# Patient Record
Sex: Female | Born: 1970 | Race: White | Hispanic: No | Marital: Married | Smoking: Never smoker
Health system: Southern US, Community
[De-identification: ages and names within clinical notes are randomized; demographics above are authoritative.]

## PROBLEM LIST (undated history)

## (undated) DIAGNOSIS — C801 Malignant (primary) neoplasm, unspecified: Secondary | ICD-10-CM

## (undated) DIAGNOSIS — F32A Depression, unspecified: Secondary | ICD-10-CM

## (undated) DIAGNOSIS — K219 Gastro-esophageal reflux disease without esophagitis: Secondary | ICD-10-CM

## (undated) DIAGNOSIS — F419 Anxiety disorder, unspecified: Secondary | ICD-10-CM

## (undated) DIAGNOSIS — F329 Major depressive disorder, single episode, unspecified: Secondary | ICD-10-CM

## (undated) DIAGNOSIS — T7840XA Allergy, unspecified, initial encounter: Secondary | ICD-10-CM

## (undated) HISTORY — DX: Gastro-esophageal reflux disease without esophagitis: K21.9

## (undated) HISTORY — DX: Depression, unspecified: F32.A

## (undated) HISTORY — DX: Allergy, unspecified, initial encounter: T78.40XA

## (undated) HISTORY — DX: Anxiety disorder, unspecified: F41.9

## (undated) HISTORY — DX: Malignant (primary) neoplasm, unspecified: C80.1

## (undated) HISTORY — PX: CERVICAL FUSION: SHX112

---
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
## 1997-12-04 ENCOUNTER — Ambulatory Visit (HOSPITAL_COMMUNITY): Admission: RE | Admit: 1997-12-04 | Discharge: 1997-12-04 | Payer: Self-pay | Admitting: Obstetrics and Gynecology

## 1998-01-23 ENCOUNTER — Inpatient Hospital Stay (HOSPITAL_COMMUNITY): Admission: AD | Admit: 1998-01-23 | Discharge: 1998-01-23 | Payer: Self-pay | Admitting: Obstetrics and Gynecology

## 1998-01-27 ENCOUNTER — Encounter (HOSPITAL_COMMUNITY): Admission: RE | Admit: 1998-01-27 | Discharge: 1998-02-20 | Payer: Self-pay | Admitting: Obstetrics and Gynecology

## 1998-02-18 ENCOUNTER — Inpatient Hospital Stay (HOSPITAL_COMMUNITY): Admission: AD | Admit: 1998-02-18 | Discharge: 1998-02-23 | Payer: Self-pay | Admitting: Obstetrics and Gynecology

## 1998-02-25 ENCOUNTER — Inpatient Hospital Stay (HOSPITAL_COMMUNITY): Admission: AD | Admit: 1998-02-25 | Discharge: 1998-02-25 | Payer: Self-pay | Admitting: Obstetrics and Gynecology

## 1998-12-19 ENCOUNTER — Encounter: Payer: Self-pay | Admitting: Internal Medicine

## 1998-12-19 ENCOUNTER — Ambulatory Visit (HOSPITAL_COMMUNITY): Admission: RE | Admit: 1998-12-19 | Discharge: 1998-12-19 | Payer: Self-pay | Admitting: Internal Medicine

## 2001-02-21 ENCOUNTER — Other Ambulatory Visit: Admission: RE | Admit: 2001-02-21 | Discharge: 2001-02-21 | Payer: Self-pay | Admitting: Obstetrics and Gynecology

## 2001-05-15 ENCOUNTER — Encounter: Admission: RE | Admit: 2001-05-15 | Discharge: 2001-05-15 | Payer: Self-pay | Admitting: Obstetrics and Gynecology

## 2001-05-15 ENCOUNTER — Encounter: Payer: Self-pay | Admitting: Obstetrics and Gynecology

## 2001-09-26 ENCOUNTER — Inpatient Hospital Stay (HOSPITAL_COMMUNITY): Admission: AD | Admit: 2001-09-26 | Discharge: 2001-09-26 | Payer: Self-pay | Admitting: Obstetrics and Gynecology

## 2001-10-31 ENCOUNTER — Encounter: Payer: Self-pay | Admitting: Obstetrics and Gynecology

## 2001-10-31 ENCOUNTER — Ambulatory Visit (HOSPITAL_COMMUNITY): Admission: RE | Admit: 2001-10-31 | Discharge: 2001-10-31 | Payer: Self-pay | Admitting: Obstetrics and Gynecology

## 2002-03-28 ENCOUNTER — Inpatient Hospital Stay (HOSPITAL_COMMUNITY): Admission: AD | Admit: 2002-03-28 | Discharge: 2002-03-31 | Payer: Self-pay | Admitting: Obstetrics and Gynecology

## 2002-05-09 ENCOUNTER — Other Ambulatory Visit: Admission: RE | Admit: 2002-05-09 | Discharge: 2002-05-09 | Payer: Self-pay | Admitting: Obstetrics and Gynecology

## 2003-07-18 ENCOUNTER — Encounter: Admission: RE | Admit: 2003-07-18 | Discharge: 2003-07-18 | Payer: Self-pay | Admitting: Obstetrics and Gynecology

## 2004-09-08 ENCOUNTER — Other Ambulatory Visit: Admission: RE | Admit: 2004-09-08 | Discharge: 2004-09-08 | Payer: Self-pay | Admitting: Obstetrics and Gynecology

## 2004-10-12 ENCOUNTER — Ambulatory Visit (HOSPITAL_COMMUNITY): Admission: RE | Admit: 2004-10-12 | Discharge: 2004-10-12 | Payer: Self-pay | Admitting: Internal Medicine

## 2005-09-01 ENCOUNTER — Encounter: Admission: RE | Admit: 2005-09-01 | Discharge: 2005-09-01 | Payer: Self-pay | Admitting: Internal Medicine

## 2007-06-02 IMAGING — MG MM DIAGNOSTIC BILATERAL
5 series · 5 of 5 positions shown · non-contrast
Comparison: none

DG DIAGNOSTIC BILATERAL
Bilateral CC and MLO view(s) were taken.

LEFT BREAST ULTRASOUND
DIGITAL BILATERAL DIAGNOSTIC MAMMOGRAM WITH CAD AND LEFT BREAST ULTRASOUND:
CLINICAL DATA: The patient is experiencing focal pain and a "hard place" in the left upper outer 
quadrant.  She states the hardness has resolved today.

[R CC]
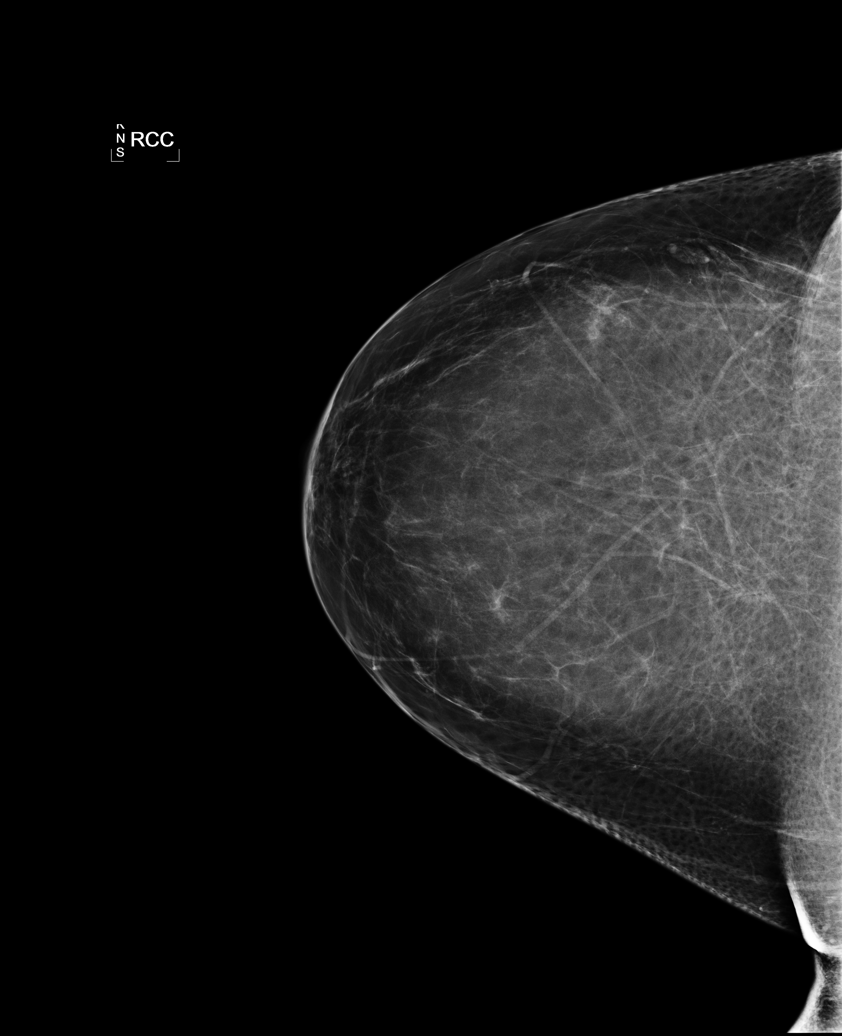

[L CC]
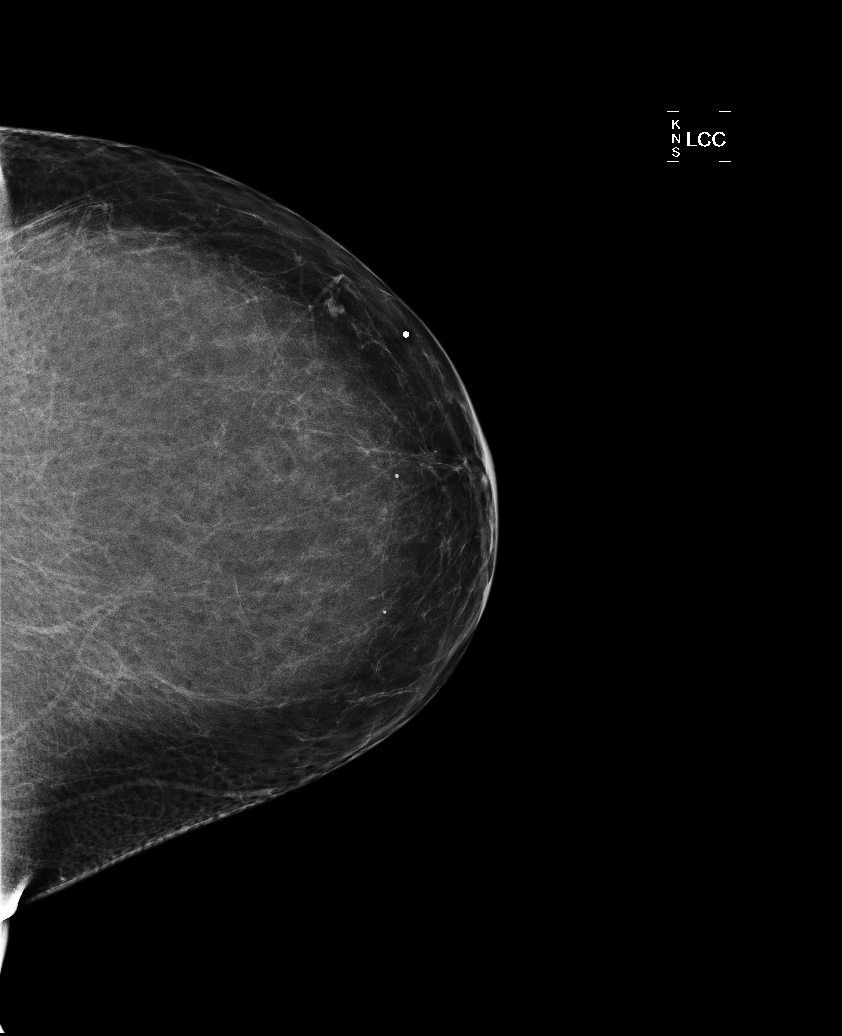

[L MLO]
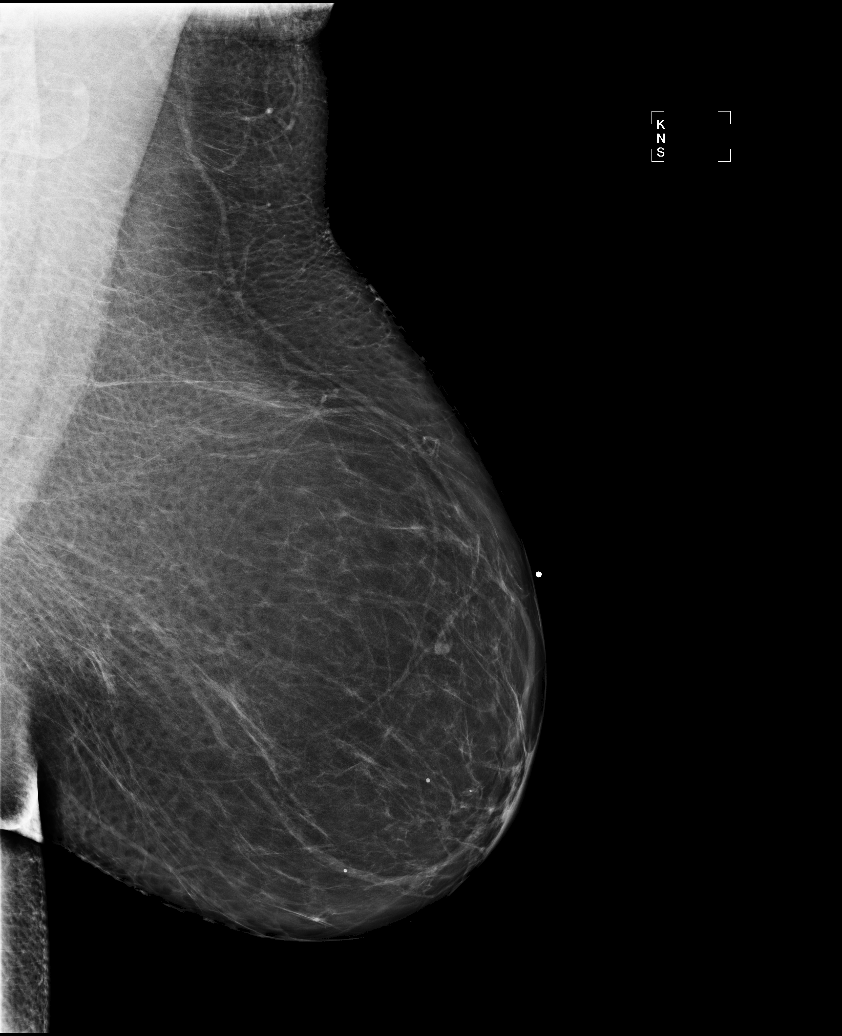

[R MLO]
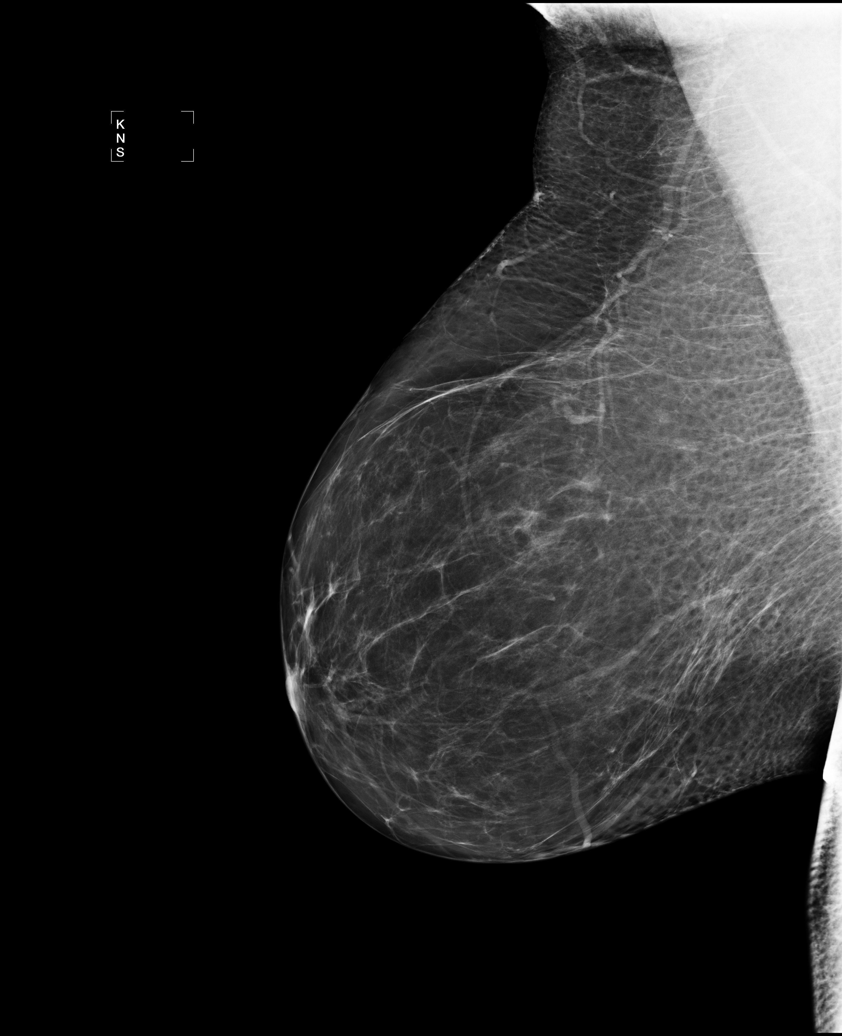

[L TAN]
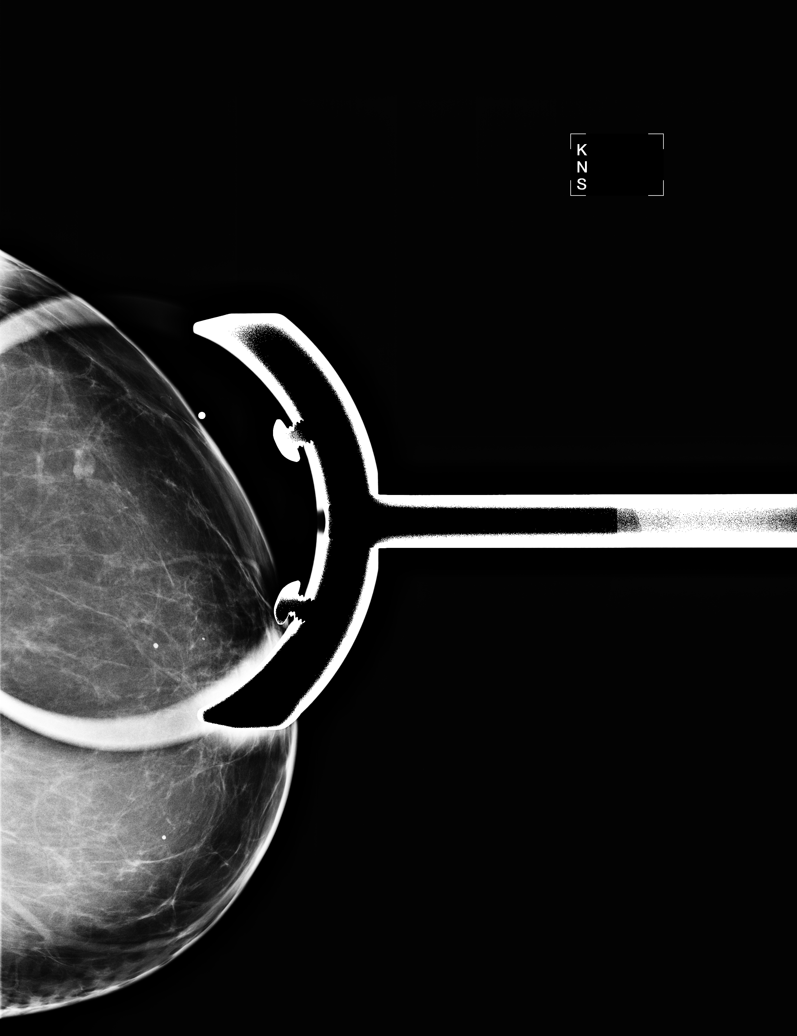

[5 of 5 positions shown; findings below may reference images not displayed]

Comparison is made to prior study dated 07-18-03.  The breast tissue is almost entirely fatty.  
There is no dominant mass, architectural distortion or calcification to suggest malignancy.  There 
is an intramammary lymph node in the left upper outer quadrant.

On physical examination, no mass is palpated either by me or the patient in the left anterior upper
outer quadrant.  Sonography demonstrates fatty tissue.  The intramammary lymph node seen on the 
mammogram is visualized but this is not in the area of most pain for the patient.
IMPRESSION: No mammographic or sonographic evidence of malignancy.   Yearly screening mammography is suggested 
beginning at age 40 unless clinically indicated earlier.

ASSESSMENT: Negative - BI-RADS 1

Routine screening mammogram at age 40.
ANALYZED BY COMPUTER AIDED DETECTION. ,

## 2010-05-17 ENCOUNTER — Encounter: Payer: Self-pay | Admitting: Internal Medicine

## 2017-11-09 ENCOUNTER — Other Ambulatory Visit (HOSPITAL_COMMUNITY): Payer: Self-pay | Admitting: Neurological Surgery

## 2017-11-09 DIAGNOSIS — M542 Cervicalgia: Secondary | ICD-10-CM

## 2017-11-09 DIAGNOSIS — M79602 Pain in left arm: Secondary | ICD-10-CM

## 2017-11-18 ENCOUNTER — Ambulatory Visit (HOSPITAL_COMMUNITY)
Admission: RE | Admit: 2017-11-18 | Discharge: 2017-11-18 | Disposition: A | Discharge status: Home / Self Care | Payer: 59 | Source: Ambulatory Visit | Attending: Neurological Surgery | Admitting: Neurological Surgery

## 2017-11-18 DIAGNOSIS — M542 Cervicalgia: Secondary | ICD-10-CM

## 2017-11-18 DIAGNOSIS — M79602 Pain in left arm: Secondary | ICD-10-CM | POA: Diagnosis present

## 2017-11-18 DIAGNOSIS — Z981 Arthrodesis status: Secondary | ICD-10-CM | POA: Insufficient documentation

## 2018-12-12 ENCOUNTER — Other Ambulatory Visit: Payer: Self-pay | Admitting: Internal Medicine

## 2018-12-12 DIAGNOSIS — N6089 Other benign mammary dysplasias of unspecified breast: Secondary | ICD-10-CM

## 2018-12-28 ENCOUNTER — Other Ambulatory Visit: Payer: Self-pay | Admitting: *Deleted

## 2018-12-28 ENCOUNTER — Telehealth: Payer: Self-pay | Admitting: *Deleted

## 2018-12-28 ENCOUNTER — Ambulatory Visit (INDEPENDENT_AMBULATORY_CARE_PROVIDER_SITE_OTHER): Payer: 59 | Admitting: Neurology

## 2018-12-28 ENCOUNTER — Other Ambulatory Visit: Payer: Self-pay

## 2018-12-28 ENCOUNTER — Encounter: Payer: Self-pay | Admitting: Neurology

## 2018-12-28 VITALS — BP 142/84 | HR 88 | Temp 98.0°F | Ht 64.0 in | Wt 245.0 lb

## 2018-12-28 DIAGNOSIS — R002 Palpitations: Secondary | ICD-10-CM

## 2018-12-28 DIAGNOSIS — G478 Other sleep disorders: Secondary | ICD-10-CM | POA: Diagnosis not present

## 2018-12-28 DIAGNOSIS — E66813 Obesity, class 3: Secondary | ICD-10-CM | POA: Insufficient documentation

## 2018-12-28 DIAGNOSIS — Z6841 Body Mass Index (BMI) 40.0 and over, adult: Secondary | ICD-10-CM | POA: Diagnosis not present

## 2018-12-28 NOTE — Telephone Encounter (Signed)
ZIO XT long term holter monitor to be mailed to patients home. Instructions reviewed briefly with patient as they are included in the monitor kit.

## 2018-12-28 NOTE — Progress Notes (Signed)
SLEEP MEDICINE CLINIC    Provider:  Larey Seat, MD  Primary Care Physician:  Sueanne Margarita [address] [place] [address]     Referring Provider: Sueanne Margarita, DO         Chief Complaint according to patient   Patient presents with:    . New Patient (Initial Visit)     pt denies snoring in sleep, never had sleep study before. PCP wanted to rule out. wakes up frequently in sleep.Reports palpitations and morning headaches.       HISTORY OF PRESENT ILLNESS:  Sheena Sanders is a 48 y.o. year old White female patient seen here face to face on 12/28/2018 for a sleep consultation.  Chief concern according to patient : " I wake up for years at 3.30 Am or 4 AM, but new is an onset of severe fatigue. by 4 Pm I either have to give into sleep or start some activity.    I have the pleasure of seeing Sheena Sanders today, a right-handed  Caucasian female and daughter of an established patient in our practice.   She has GERD, Insomnia, fatigue, obesity. previous anterior fusion surgery for cervical DDD. No previous sleep study. New onset burning in feet and toes, either side, not together. Right hand feels sometimes as if a bug crawls over the dorsum , nothing is there.     Sleep relevant medical history:     Family medical /sleep history: mother with MS, CSA, OSA, obesity , atrial fibrillation.   a fib in both parents. Father is caretaker of her handicapped mother, Sheena Sanders. Social history:  Patient is retired from [employer], due to covid and lives in a household with 4 persons. Family status is married with 2 children, joseph 64, Walker 57 years old.  Pets - 75 pound dog.  Tobacco use: never .  ETOH use; 3/ week , Caffeine intake in form of Coffee( 2 cups ) Soda( none Tea ( none) nor energy drinks. Regular exercise in form of walking    Hobbies :gardening, bird watching.     Sleep habits are as follows: The patient's dinner time is between 6 PM. The patient goes to bed at 10-  12  PM and continues to sleep for 4 hours, but has no bathroom breaks,but spontaneous  the first time 3-4 AM.   The preferred sleep position is the right side, with the support of 2 pillows. Adjustable head of bed.  Dreams are reportedly frequent/vivid. She dreams and wakes out of dreams.  She has palpitations if she drank alcohol.  8 AM is the usual rise time. The patient wakes up spontaneously. Bedroom is cool, quiet and dark.  She reports not feeling refreshed or restored in AM, sometimes groggy -with symptoms such as dry mouth, morning headaches and residual fatigue.  Naps are frequently  taken at 4 PM if she can not longer fight sleepiness, duration 20 minutes- refreshing. More refreshing than nocturnal sleep.    Review of Systems: Out of a complete 14 system review, the patient complains of only the following symptoms, and all other reviewed systems are negative.:  Fatigue, sleepiness , she denies snoring, fragmented sleep.   How likely are you to doze in the following situations: 0 = not likely, 1 = slight chance, 2 = moderate chance, 3 = high chance   Sitting and Reading? Watching Television? Sitting inactive in a public place (theater or meeting)? As a passenger in a car  for an hour without a break? Lying down in the afternoon when circumstances permit? Sitting and talking to someone? Sitting quietly after lunch without alcohol? In a car, while stopped for a few minutes in traffic?   Total = 13/ 24 points   FSS endorsed at 33/ 63 points.     Social History   Socioeconomic History  . Marital status: Married    Spouse name: Not on file  . Number of children: Not on file  . Years of education: Not on file  . Highest education level: Not on file  Occupational History  . Not on file  Social Needs  . Financial resource strain: Not on file  . Food insecurity    Worry: Not on file    Inability: Not on file  . Transportation needs    Medical: Not on file    Non-medical:  Not on file  Tobacco Use  . Smoking status: Never Smoker  . Smokeless tobacco: Never Used  Substance and Sexual Activity  . Alcohol use: Yes    Comment: occasional  . Drug use: Never  . Sexual activity: Not on file  Lifestyle  . Physical activity    Days per week: Not on file    Minutes per session: Not on file  . Stress: Not on file  Relationships  . Social Herbalist on phone: Not on file    Gets together: Not on file    Attends religious service: Not on file    Active member of club or organization: Not on file    Attends meetings of clubs or organizations: Not on file    Relationship status: Not on file  Other Topics Concern  . Not on file  Social History Narrative  . 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 Unemployed.    Current Outpatient Medications on File Prior to Visit  Medication Sig Dispense Refill  . omeprazole (PRILOSEC) 40 MG capsule Take 40 mg by mouth daily.     No current facility-administered medications on file prior to visit.     Allergies  Allergen Reactions  . Sulfa Antibiotics Swelling and Hives    unknown     Physical exam:  Today's Vitals   12/28/18 0846  BP: (!) 142/84  Pulse: 88  Temp: 98 F (36.7 C)  Weight: 245 lb (111.1 kg)  Height: 5\' 4"  (1.626 m)   Body mass index is 42.05 kg/m.   Wt Readings from Last 3 Encounters:  12/28/18 245 lb (111.1 kg)    Ht Readings from Last 3 Encounters:  12/28/18 5\' 4"  (1.626 m)      General: The patient is awake, alert and appears not in acute distress. The patient is well groomed. Head: Normocephalic, atraumatic. Neck is supple. Mallampati 2- lateral narrowed, tonsils are large,,  neck circumference: i17nches .  Nasal airflow is  Paten but she has stuffiness t.  Retrognathia is not  seen.  Dental status: intact, there is no septal deviation.   Cardiovascular:  Regular rate and cardiac rhythm by pulse,  without  distended neck veins. Respiratory: Lungs are clear to auscultation.  Skin:  Without evidence of ankle edema, or rash. Trunk: The patient's posture is erect.   Neurologic exam : The patient is awake and alert, oriented to place and time.   Memory subjective described as intact.  Attention span & concentration ability appears normal.  Speech is fluent,  without  dysarthria, dysphonia or aphasia.  Mood and affect are appropriate.   Cranial nerves: no loss of smell or taste reported  Pupils are equal and briskly reactive to light. Funduscopic exam *defered.   Extraocular movements in vertical and horizontal planes were intact and without nystagmus. No Diplopia. Visual fields by finger perimetry are intact. Hearing was intact to soft voice and finger rubbing.   Facial sensation intact to fine touch.  Facial motor strength is symmetric and tongue and uvula move midline.  Neck ROM : rotation, tilt and flexion extension were normal for age and shoulder shrug was symmetrical.    Motor exam:  Symmetric bulk, tone and ROM.   Normal tone without cog wheeling, symmetric grip strength .   Sensory:  Fine touch, pinprick and vibration were tested  and  normal.  Proprioception tested in the upper extremities was normal.   Coordination: Rapid alternating movements in the fingers/hands were of normal speed. Handwriting has goten a bit more sloppy.  The Finger-to-nose maneuver was intact without evidence of ataxia, dysmetria or tremor.   Gait and station: Patient could rise unassisted from a seated position, walked without assistive device.  Stance is of normal width/ base and the patient turned with 3 steps.  Toe and heel walk were deferred.  Deep tendon reflexes: in the  upper and lower extremities are symmetric and intact.  Babinski response was deferred.        After spending a total time of  35  minutes face to face and additional time for physical and neurologic examination, review of  laboratory studies,  personal review of imaging studies, reports and results of other testing and review of referral information / records as far as provided in visit, I have established the following assessments:  0)  relatively recent onset of excessive daytime sleepiness. 6 month ( COVID ?) Anxiety ? There is some cyclical pattern. No snoring but fatigue- lets check for OSA, CSA, PLMs.   1) nocturnal palpitations were related to drinking alcohol, in moderation, bu it still related.   2) arm and hand numbness have been coming on and off since she was suffering form Cervical discdisease, ruptured C 4-5 .  no evidence of neuropathy at this point.    My Plan is to proceed with:  1)  I like to use an attended sleep study to screen for all sleep disorders causing interrupted , fragmented, and non restorative sleep. [employer] will permit a HST first.    I would like to thank DO Sueanne Margarita [address] [place],  [place] [address] for allowing me to meet with and to take care of this pleasant patient.   In short, Sheena Sanders is presenting with non restorative sleep  I plan to follow up either personally or through our NP within  2-3  month.     Electronically signed by: Larey Seat, MD 12/28/2018 9:07 AM  [facility] Board certified by The [employer] of Sleep Medicine and Diplomate of the [employer] of Sleep Medicine. Board certified In Neurology through the [employer], Fellow of the [employer] of Neurology. Medical Director of [facility].

## 2019-01-02 ENCOUNTER — Ambulatory Visit (INDEPENDENT_AMBULATORY_CARE_PROVIDER_SITE_OTHER): Payer: 59

## 2019-01-02 DIAGNOSIS — R002 Palpitations: Secondary | ICD-10-CM

## 2019-01-17 ENCOUNTER — Ambulatory Visit (INDEPENDENT_AMBULATORY_CARE_PROVIDER_SITE_OTHER): Payer: 59 | Admitting: Neurology

## 2019-01-17 DIAGNOSIS — Z6841 Body Mass Index (BMI) 40.0 and over, adult: Secondary | ICD-10-CM

## 2019-01-17 DIAGNOSIS — G4710 Hypersomnia, unspecified: Secondary | ICD-10-CM

## 2019-01-17 DIAGNOSIS — G478 Other sleep disorders: Secondary | ICD-10-CM

## 2019-01-17 DIAGNOSIS — R002 Palpitations: Secondary | ICD-10-CM

## 2019-01-23 ENCOUNTER — Other Ambulatory Visit: Payer: Self-pay

## 2019-01-25 ENCOUNTER — Telehealth: Payer: Self-pay | Admitting: Neurology

## 2019-01-25 NOTE — Progress Notes (Signed)
Patient Information     First Name: Sheena Last Name: Sanders ID: [id number]  Birth Date: [birth year] Age: 48 Gender: Female  Referring Provider: Sueanne Margarita, DO   BMI: 41.8 (W=244 lb, H=5' 4'')  Neck Circ.:  13 '' Epworth:  17/24   Sleep Study Information    Study Date: Jan 17, 2019 S/H/A Version: 001.001.001.001 / 4.0.1515 / 58  History:    [name] is a 48 y.o. year old White female patient seen on 12/28/2018 for a sleep consultation.  Chief concern according to patient: "I have been waking up for years at 3.30 am or 4 am, but new is an onset of severe fatigue in the afternoons. By 4 pm I either have to give into sleep or start some activity".     I have the pleasure of seeing Jali Cockfield. Shippey today, a right-handed Caucasian female and daughter of an established patient in our practice (Mrs. Berdine Addison). She has GERD, Insomnia, fatigue, obesity. Previous anterior fusion surgery for cervical DDD. No previous sleep study. New onset burning in feet and toes, either side, not simultaneously /Right hand feels sometimes as if a bug crawls over the dorsum.            Summary & Diagnosis:    No significant apnea or snoring was recorded, no hypoxemia, and no cardiac a rhythm abnormality.  The technical data were not excellent, due to artefact. The Valid Total Sleep Time was only 1h and 32 minutes.   Recommendations:     If these results are very different form the patient's clinical experience of sleep, I would opt to follow up with an attended sleep study.  Electronically Signed: Larey Seat, MD             Sleep Summary  Oxygen Saturation Statistics   Start Study Time: End Study Time: Total Recording Time:  10:38:38 PM  6:34:27 AM  7 h, 55 min  Total Sleep Time % REM of Sleep Time:  7 h, 2 min 22.1    Mean: 96 Minimum: 94 Maximum: 99  Mean of Desaturations Nadirs (%):   N/A  Oxygen Desaturation. %:   4-9 10-20 >20 Total  Events Number Total  0 0  0.0 0.0  0 0.0  0 0.0   Oxygen Saturation: <90 <=88 <85 <80 <70  Duration (minutes): Sleep % 0.0 0.0  0.0 0.0  0.0 0.0 0.0 0.0 0.0 0.0     Respiratory Indices      Total Events REM NREM All Night  pRDI:  2  pAHI:  2 ODI:  0  pAHIc: 0  % CSR: 0.0 40.0 40.0 0.0 0.0 0.7 0.7 0.0 0.0 1.3 1.3 0.0 0.0       Pulse Rate Statistics during Sleep (BPM)      Mean: 67 Minimum: 51 Maximum: 94    Indices are calculated using technically valid sleep time of 1 h, 32 min. pRDI/pAHI are calculated using oxi desaturations ? 3%  Body Position Statistics  Position Supine Prone Right Left Non-Supine  Sleep (min) 160.5 18.5 190.5 53.0 262.0  Sleep % 38.0 4.4 45.1 12.5 62.0  pRDI N/A N/A 0.7 N/A 0.7  pAHI N/A N/A 0.7 N/A 0.7  ODI N/A N/A 0.0 N/A 0.0     Snoring Statistics Snoring Level (dB) >40 >50 >60 >70 >80 >Threshold (45)  Sleep (min) 67.0 6.6 1.7 0.0 0.0 12.2  Sleep % 15.9 1.6 0.4 0.0 0.0 2.9    Mean: 41  dB

## 2019-01-25 NOTE — Addendum Note (Signed)
Addended by: Larey Seat on: 01/25/2019 01:05 PM   Modules accepted: Orders

## 2019-01-25 NOTE — Procedures (Addendum)
Patient Information     First Name: Nyaisha Last Name: Schwendemann ID: [id number]  Birth Date: 1971-02-09 Age: 48 Gender: Female  Referring Provider: Sueanne Margarita, DO   BMI: 41.8 (W=244 lb, H=5' 4'')  Neck Circ.:  13 '' Epworth:  17/24   Sleep Study Information    Study Date: Jan 17, 2019 S/H/A Version: 001.001.001.001 / 4.0.1515 / 71  History:    Sheena Sanders is a 48 y.o. year old White female patient seen on 12/28/2018 for a sleep consultation.  Chief concern according to patient: "I have been waking up for years at 3.30 am or 4 am, but new is an onset of severe fatigue in the afternoons. By 4 pm I either have to give into sleep or start some activity".     I have the pleasure of seeing Sheena Sanders today, a right-handed Caucasian female and daughter of an established patient in our practice (Sheena Sanders). She has GERD, Insomnia, fatigue, obesity. Previous anterior fusion surgery for cervical DDD. No previous sleep study. New onset burning in feet and toes, either side, not simultaneously /Right hand feels sometimes as if a bug crawls over the dorsum.            Summary & Diagnosis:    No significant apnea or snoring was recorded, no hypoxemia, and no cardiac a rhythm abnormality.  The technical data were not excellent, due to artefact. The Valid Total Sleep Time was only 1h and 32 minutes.   Recommendations:     If these results are very different form the patient's clinical experience of sleep, I would opt to follow up with an attended sleep study.  Electronically Signed: Larey Seat, MD             Sleep Summary  Oxygen Saturation Statistics   Start Study Time: End Study Time: Total Recording Time:  10:38:38 PM  6:34:27 AM  7 h, 55 min  Total Sleep Time % REM of Sleep Time:  7 h, 2 min 22.1    Mean: 96 Minimum: 94 Maximum: 99  Mean of Desaturations Nadirs (%):   N/A  Oxygen Desaturation. %:   4-9 10-20 >20 Total  Events Number Total  0 0  0.0 0.0  0 0.0  0 0.0   Oxygen Saturation: <90 <=88 <85 <80 <70  Duration (minutes): Sleep % 0.0 0.0  0.0 0.0  0.0 0.0 0.0 0.0 0.0 0.0     Respiratory Indices      Total Events REM NREM All Night  pRDI:  2  pAHI:  2 ODI:  0  pAHIc: 0  % CSR: 0.0 40.0 40.0 0.0 0.0 0.7 0.7 0.0 0.0 1.3 1.3 0.0 0.0       Pulse Rate Statistics during Sleep (BPM)      Mean: 67 Minimum: 51 Maximum: 94    Indices are calculated using technically valid sleep time of 1 h, 32 min. pRDI/pAHI are calculated using oxi desaturations ? 3%  Body Position Statistics  Position Supine Prone Right Left Non-Supine  Sleep (min) 160.5 18.5 190.5 53.0 262.0  Sleep % 38.0 4.4 45.1 12.5 62.0  pRDI N/A N/A 0.7 N/A 0.7  pAHI N/A N/A 0.7 N/A 0.7  ODI N/A N/A 0.0 N/A 0.0     Snoring Statistics Snoring Level (dB) >40 >50 >60 >70 >80 >Threshold (45)  Sleep (min) 67.0 6.6 1.7 0.0 0.0 12.2  Sleep % 15.9 1.6 0.4 0.0 0.0 2.9    Mean: 41  dB

## 2019-01-25 NOTE — Telephone Encounter (Signed)
-----   Message from Larey Seat, MD sent at 01/25/2019  1:05 PM EDT ----- May need to repeat - see results. Only valid test time of 98 minutes - may offer an in lab study or repeat HST.

## 2019-01-25 NOTE — Telephone Encounter (Signed)
Called the pt and reviewed her sleep study I advised the patient that the sleep time indicated it was under 1.5 of sleep in the tech data. Pt states that she had more sleep then that. The other data indicated 7 hrs of sleep with 22 % of REM. Pt made aware there was no apnea found and would like a copy of the study sent to her home

## 2019-01-30 ENCOUNTER — Telehealth: Payer: Self-pay | Admitting: Neurology

## 2019-02-28 ENCOUNTER — Telehealth: Payer: Self-pay

## 2019-02-28 NOTE — Telephone Encounter (Signed)
Pt states that now is not a good time for her to come in for the in lab sleep study. Pt declined to do another HST as well. Pt states she wants to wait until after the new year to have sleep study in lab. Pt advised to call me back when ready to schedule.

## 2019-04-05 ENCOUNTER — Telehealth: Payer: Self-pay | Admitting: *Deleted

## 2019-04-05 NOTE — Telephone Encounter (Signed)
New message   [facility] states that the patient has not received the results and they have not received the results back at their office. Please call the patient.

## 2019-04-05 NOTE — Telephone Encounter (Signed)
Patient had not received her ZIO patch monitor results.  [facility] phone note asked to call patient. Monitor report was imported 01/23/19 and read by Dr. Acie Fredrickson on 01/24/19. Report should have been automatically faxed to [phone].  I selected second location listed for Dr. Francesco Sor (the [facility] location) and I believe the report was automatically refaxed to [phone].  The patient was called and read Dr. Lanny Hurst study highlights.  [facility] was called and a message was left for Dr. Jaquita Folds nurse stating report being refaxed to [phone].  I will be in the office on Monday, 04/09/19 and will refax the report again to assure delivery.

## 2019-07-24 ENCOUNTER — Telehealth: Payer: Self-pay | Admitting: Physician Assistant

## 2019-07-24 ENCOUNTER — Encounter: Payer: Self-pay | Admitting: Physician Assistant

## 2019-08-08 ENCOUNTER — Encounter: Payer: Self-pay | Admitting: Physician Assistant

## 2019-08-08 ENCOUNTER — Ambulatory Visit: Payer: 59 | Admitting: Physician Assistant

## 2019-08-08 VITALS — BP 130/70 | HR 64 | Temp 97.9°F | Ht 64.0 in | Wt 237.0 lb

## 2019-08-08 DIAGNOSIS — K582 Mixed irritable bowel syndrome: Secondary | ICD-10-CM

## 2019-08-08 DIAGNOSIS — K602 Anal fissure, unspecified: Secondary | ICD-10-CM

## 2019-08-08 DIAGNOSIS — Z1211 Encounter for screening for malignant neoplasm of colon: Secondary | ICD-10-CM | POA: Diagnosis not present

## 2019-08-08 DIAGNOSIS — K760 Fatty (change of) liver, not elsewhere classified: Secondary | ICD-10-CM

## 2019-08-08 DIAGNOSIS — Z1212 Encounter for screening for malignant neoplasm of rectum: Secondary | ICD-10-CM

## 2019-08-08 MED ORDER — AMBULATORY NON FORMULARY MEDICATION: 0 refills | Status: DC

## 2019-08-08 NOTE — Progress Notes (Signed)
Chief Complaint: Rectal pain, IBS  HPI:    Sheena Sanders is a 49 year old female with a past medical history as listed below, who was referred to me by Sueanne Margarita, DO for a complaint of IBS and rectal pain.      Today, the patient presents clinic and explains that her OB/GYN diagnosed her with IBS years ago because typically she will wake up and have a solid stool and then drink coffee and have looser stools.  She has 2-4 bowel movements a day and they range from solid to liquid.  Patient tells me she has decreased her carbs before and this has helped with this.  This has occurred ever since she was a teenager.  Associated symptoms include a lot of bloating and gas/air.    The main complaint for the patient today is that she is having a dull ache in her rectum which she thinks is a muscle spasm.  Tells me that about a month and a half ago she had one harder than normal stool which was very painful to pass and since then she has had off-and-on pain in her rectum which seems to radiate up into the inside.  It will get bad and then get better and then flare again typically after she has a hard stool.  She thinks this may be related to new vitamins she is taking with iron.  She has not seen any rectal bleeding.      Interestingly patient tells me she was diagnosed with a nonfunctioning gallbladder which is "so small that they can not see it on CT".  Apparently had issues with this in the past nausea etc. but is having none of those symptoms anymore.  They discussed taking it out but told her that she is basically functioning like she does not have one anyways.    Also reports history of fatty liver with LFTs being monitored by PCP.    Patient tells me she previously followed with Dr. 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  She is requesting Dr. Tarri Glenn today as that is who her mother sees here.    Denies fever, chills, weight loss, abdominal pain  or symptoms that awaken her from sleep.  No past medical history on file.  Past Surgical History:  Procedure Laterality Date  . CERVICAL FUSION    . CESAREAN SECTION     x2    Current Outpatient Medications  Medication Sig Dispense Refill  . omeprazole (PRILOSEC) 40 MG capsule Take 40 mg by mouth daily.     No current facility-administered medications for this visit.    Allergies as of 08/08/2019 - Review Complete 08/08/2019  Allergen Reaction Noted  . Sulfa antibiotics Swelling and Hives 05/26/2013    Family History  Problem Relation Age of Onset  . Heart disease Mother   . Multiple sclerosis Mother   . Hypertension Father     Social History   Socioeconomic History  . Marital status: Married    Spouse name: Not on file  . Number of children: 2  . Years of education: Not on file  . Highest education level: Not on file  Occupational History  . Not on file  Tobacco Use  . Smoking status: Never Smoker  . Smokeless tobacco: Never Used  Substance and Sexual Activity  . Alcohol use: Yes    Comment: occasional  . Drug use: Never  . Sexual activity: Not  on file  Other Topics Concern  . Not on file  Social History Narrative  . Not on file   Social Determinants of Health   Financial Resource Strain:   . Difficulty of Paying Living Expenses:   Food Insecurity:   . Worried About Charity fundraiser in the Last Year:   . Arboriculturist in the Last Year:   Transportation Needs:   . Film/video editor (Medical):   Marland Kitchen Lack of Transportation (Non-Medical):   Physical Activity:   . Days of Exercise per Week:   . Minutes of Exercise per Session:   Stress:   . Feeling of Stress :   Social Connections:   . Frequency of Communication with Friends and Family:   . Frequency of Social Gatherings with Friends and Family:   . Attends Religious Services:   . Active Member of Clubs or Organizations:   . Attends Archivist Meetings:   Marland Kitchen Marital Status:     Intimate Partner Violence:   . Fear of Current or Ex-Partner:   . Emotionally Abused:   Marland Kitchen Physically Abused:   . Sexually Abused:     Review of Systems:    Constitutional: No weight loss, fever or chills Skin: No rash  Cardiovascular: No chest pain Respiratory: No SOB  Gastrointestinal: See HPI and otherwise negative Genitourinary: No dysuria  Neurological: No headache Musculoskeletal: No new muscle or joint pain Hematologic: No bleeding  Psychiatric: No history of depression or anxiety   Physical Exam:  Vital signs: BP 130/70   Pulse 64   Temp 97.9 F (36.6 C)   Ht 5\' 4"  (1.626 m)   Wt 237 lb (107.5 kg)   BMI 40.68 kg/m   Constitutional:   Pleasant Caucasian female appears to be in NAD, Well developed, Well nourished, alert and cooperative Head:  Normocephalic and atraumatic. Eyes:   PEERL, EOMI. No icterus. Conjunctiva pink. Ears:  Normal auditory acuity. Neck:  Supple Throat: Oral cavity and pharynx without inflammation, swelling or lesion.  Respiratory: Respirations even and unlabored. Lungs clear to auscultation bilaterally.   No wheezes, crackles, or rhonchi.  Cardiovascular: Normal S1, S2. No MRG. Regular rate and rhythm. No peripheral edema, cyanosis or pallor.  Gastrointestinal:  Soft, nondistended, nontender. No rebound or guarding. Normal bowel sounds. No appreciable masses or hepatomegaly. Rectal: External: Posterior fissure which bleeds upon palpation; internal exam not done Msk:  Symmetrical without gross deformities. Without edema, no deformity or joint abnormality.  Neurologic:  Alert and  oriented x4;  grossly normal neurologically.  Skin:   Dry and intact without significant lesions or rashes. Psychiatric:  Demonstrates good judgement and reason without abnormal affect or behaviors.  No recent labs/imaging.  Assessment: 1.  IBS: Some diarrhea and constipation which seems related to diet 2.  Anal fissure: Seen at time of exam today, likely because  of rectal pain 3.  Screening for colorectal cancer: Patient is 18 never had colon cancer screening 4. Fattty liver: per pt monitored by PCP  Plan: 1.  Patient is 58 and due for screening for colorectal cancer.  She requested Dr. Tarri Glenn.  This will be in the [place].  Did discuss risks, benefits, limitations and alternatives and patient agrees to proceed.  Should be Covid tested 2 days prior to time procedure. (patient opted to wait to schedule because she can not do this until June). 2.  Prescribed nitroglycerin ointment 0.125% applied 3 times daily x6-8 weeks to anal fissure. 3.  Discussed keeping the stools soft solid.  Recommend she increase fiber in her diet to 25 to 35 g/day through her diet and the use of a supplement. 4.  Patient to follow in clinic per recommendations from Dr. Tarri Glenn after time of procedure.  Ellouise Newer, PA-C [facility] 08/08/2019, 10:41 AM  Cc: Sueanne Margarita, DO

## 2019-08-08 NOTE — Progress Notes (Signed)
Reviewed and agree with management plans. ? ?Dannika Hilgeman L. Zabrina Brotherton, MD, MPH  ?

## 2019-08-08 NOTE — Patient Instructions (Addendum)
If you are age 49 or older, your body mass index should be between 23-30. Your Body mass index is 40.68 kg/m. If this is out of the aforementioned range listed, please consider follow up with your Primary Care Provider.  If you are age 45 or younger, your body mass index should be between 19-25. Your Body mass index is 40.68 kg/m. If this is out of the aformentioned range listed, please consider follow up with your Primary Care Provider.    It has been recommended to you by your physician that you have a(n) Colonoscopy completed. Per your request, we did not schedule the procedure(s) today. Please contact our office at [phone] should you decide to have the procedure completed.  [facility]'s information is below: Address: [address] [place], [place] [address]  [phone]  *Please DO NOT go directly from our office to pick up this medication! Give the pharmacy 1 day to process the prescription as this is compounded and takes time to make.  Nitroglycerin ointment use three time a day for 6-8 weeks  Increase your fiber intake to 25-35 grams a day, either through diet or a supplement.  Due to recent changes in healthcare laws, you may see the results of your imaging and laboratory studies on MyChart before your provider has had a chance to review them.  We understand that in some cases there may be results that are confusing or concerning to you. Not all laboratory results come back in the same time frame and the provider may be waiting for multiple results in order to interpret others.  Please give [place] 48 hours in order for your provider to thoroughly review all the results before contacting the office for clarification of your results.   Thank you for choosing me and [facility]

## 2019-08-30 ENCOUNTER — Other Ambulatory Visit: Payer: Self-pay

## 2019-08-30 ENCOUNTER — Ambulatory Visit (AMBULATORY_SURGERY_CENTER): Payer: Self-pay | Admitting: *Deleted

## 2019-08-30 VITALS — Temp 98.4°F | Ht 64.0 in | Wt 240.0 lb

## 2019-08-30 DIAGNOSIS — Z1211 Encounter for screening for malignant neoplasm of colon: Secondary | ICD-10-CM

## 2019-08-30 MED ORDER — SUTAB 1479-225-188 MG PO TABS: 1.0000 | ORAL_TABLET | Freq: Once | ORAL | 0 refills | Status: AC

## 2019-08-30 NOTE — Progress Notes (Signed)

## 2019-09-06 ENCOUNTER — Encounter: Payer: Self-pay | Admitting: Gastroenterology

## 2019-09-12 ENCOUNTER — Ambulatory Visit (AMBULATORY_SURGERY_CENTER): Payer: 59 | Admitting: Gastroenterology

## 2019-09-12 ENCOUNTER — Other Ambulatory Visit: Payer: Self-pay

## 2019-09-12 ENCOUNTER — Encounter: Payer: Self-pay | Admitting: Gastroenterology

## 2019-09-12 VITALS — BP 119/72 | HR 77 | Temp 96.8°F | Resp 13 | Ht 64.0 in | Wt 240.0 lb

## 2019-09-12 DIAGNOSIS — D120 Benign neoplasm of cecum: Secondary | ICD-10-CM

## 2019-09-12 DIAGNOSIS — Z1211 Encounter for screening for malignant neoplasm of colon: Secondary | ICD-10-CM

## 2019-09-12 MED ORDER — SODIUM CHLORIDE 0.9 % IV SOLN: 500.0000 mL | Freq: Once | INTRAVENOUS | Status: DC

## 2019-09-12 NOTE — Patient Instructions (Signed)
Please read handouts provided. Continue present medications. Await pathology results.   YOU HAD AN ENDOSCOPIC PROCEDURE TODAY AT THE [facility]:   Refer to the procedure report that was given to you for any specific questions about what was found during the examination.  If the procedure report does not answer your questions, please call your gastroenterologist to clarify.  If you requested that your care partner not be given the details of your procedure findings, then the procedure report has been included in a sealed envelope for you to review at your convenience later.  YOU SHOULD EXPECT: Some feelings of bloating in the abdomen. Passage of more gas than usual.  Walking can help get rid of the air that was put into your GI tract during the procedure and reduce the bloating. If you had a lower endoscopy (such as a colonoscopy or flexible sigmoidoscopy) you may notice spotting of blood in your stool or on the toilet paper. If you underwent a bowel prep for your procedure, you may not have a normal bowel movement for a few days.  Please Note:  You might notice some irritation and congestion in your nose or some drainage.  This is from the oxygen used during your procedure.  There is no need for concern and it should clear up in a day or so.  SYMPTOMS TO REPORT IMMEDIATELY:  Following lower endoscopy (colonoscopy or flexible sigmoidoscopy):  Excessive amounts of blood in the stool  Significant tenderness or worsening of abdominal pains  Swelling of the abdomen that is new, acute  Fever of 100F or higher   For urgent or emergent issues, a gastroenterologist can be reached at any hour by calling [phone]. Do not use MyChart messaging for urgent concerns.    DIET:  We do recommend a small meal at first, but then you may proceed to your regular diet.  Drink plenty of fluids but you should avoid alcoholic beverages for 24 hours.  ACTIVITY:  You should plan to take it easy  for the rest of today and you should NOT DRIVE or use heavy machinery until tomorrow (because of the sedation medicines used during the test).    FOLLOW UP: Our staff will call the number listed on your records 48-72 hours following your procedure to check on you and address any questions or concerns that you may have regarding the information given to you following your procedure. If we do not reach you, we will leave a message.  We will attempt to reach you two times.  During this call, we will ask if you have developed any symptoms of COVID 19. If you develop any symptoms (ie: fever, flu-like symptoms, shortness of breath, cough etc.) before then, please call [phone].  If you test positive for Covid 19 in the 2 weeks post procedure, please call and report this information to us.    If any biopsies were taken you will be contacted by phone or by letter within the next 1-3 weeks.  Please call us at [phone] if you have not heard about the biopsies in 3 weeks.    SIGNATURES/CONFIDENTIALITY: You and/or your care partner have signed paperwork which will be entered into your electronic medical record.  These signatures attest to the fact that that the information above on your After Visit Summary has been reviewed and is understood.  Full responsibility of the confidentiality of this discharge information lies with you and/or your care-partner.  

## 2019-09-12 NOTE — Progress Notes (Signed)
Report given to PACU, vss 

## 2019-09-12 NOTE — Progress Notes (Signed)
Pt's states no medical or surgical changes since previsit or office visit.  Temp- June Vitals- Courtney 

## 2019-09-12 NOTE — Op Note (Signed)
[facility] Patient Name: Sheena Sanders Procedure Date: 09/12/2019 3:18 PM MRN: [id number] Endoscopist: Thornton Park MD, MD Age: 49 Referring MD:  Date of Birth: [birth year] Gender: Female Account #: [id number] Procedure:                Colonoscopy Indications:              Screening for colorectal malignant neoplasm, This                            is the patient's first colonoscopy Medicines:                Monitored Anesthesia Care Procedure:                Pre-Anesthesia Assessment:                           - Prior to the procedure, a History and Physical                            was performed, and patient medications and                            allergies were reviewed. The patient's tolerance of                            previous anesthesia was also reviewed. The risks                            and benefits of the procedure and the sedation                            options and risks were discussed with the patient.                            All questions were answered, and informed consent                            was obtained. Prior Anticoagulants: The patient has                            taken no previous anticoagulant or antiplatelet                            agents. ASA Grade Assessment: III - A patient with                            severe systemic disease. After reviewing the risks                            and benefits, the patient was deemed in                            satisfactory condition to undergo the procedure.  After obtaining informed consent, the colonoscope                            was passed under direct vision. Throughout the                            procedure, the patient's blood pressure, pulse, and                            oxygen saturations were monitored continuously. The                            Colonoscope was introduced through the anus and                            advanced to the 3 cm  into the ileum. The                            colonoscopy was performed without difficulty. The                            patient tolerated the procedure well. The quality                            of the bowel preparation was good. The terminal                            ileum, ileocecal valve, appendiceal orifice, and                            rectum were photographed. Scope In: 3:32:43 PM Scope Out: 3:44:21 PM Scope Withdrawal Time: 0 hours 9 minutes 52 seconds  Total Procedure Duration: 0 hours 11 minutes 38 seconds  Findings:                 A well-healed posterior anal fissure was found on                            perianal exam.                           A less than 1 mm polyp was found in the cecum. The                            polyp was flat. The polyp was removed with a                            piecemeal technique using a cold biopsy forceps.                            Resection and retrieval were complete. Estimated                            blood loss was minimal.  Non-bleeding internal hemorrhoids were found. The                            hemorrhoids were small.                           The exam was otherwise without abnormality on                            direct and retroflexion views. Complications:            No immediate complications. Estimated blood loss:                            Minimal. Estimated Blood Loss:     Estimated blood loss was minimal. Impression:               - Anal fissure found on perianal exam.                           - One less than 1 mm polyp in the cecum, removed                            piecemeal using a cold biopsy forceps. Resected and                            retrieved.                           - Non-bleeding internal hemorrhoids.                           - The examination was otherwise normal on direct                            and retroflexion views. Recommendation:           - Patient has a  contact number available for                            emergencies. The signs and symptoms of potential                            delayed complications were discussed with the                            patient. Return to normal activities tomorrow.                            Written discharge instructions were provided to the                            patient.                           - Resume previous diet.                           -  Continue present medications.                           - Await pathology results.                           - Repeat colonoscopy date to be determined after                            pending pathology results are reviewed for                            surveillance.                           - Emerging evidence supports eating a diet of                            fruits, vegetables, grains, calcium, and yogurt                            while reducing red meat and alcohol may reduce the                            risk of colon cancer.                           - Thank you for allowing me to be involved in your                            colon cancer prevention. Thornton Park MD, MD 09/12/2019 3:50:08 PM This report has been signed electronically.

## 2019-09-12 NOTE — Progress Notes (Signed)
Called to room to assist during endoscopic procedure.  Patient ID and intended procedure confirmed with present staff. Received instructions for my participation in the procedure from the performing physician.  

## 2019-09-14 ENCOUNTER — Telehealth: Payer: Self-pay | Admitting: *Deleted

## 2019-09-14 NOTE — Telephone Encounter (Signed)
  Follow up Call-  Call back number 09/12/2019  Post procedure Call Back phone  # [phone]  Permission to leave phone message Yes  Some recent data might be hidden     Patient questions:  Do you have a fever, pain , or abdominal swelling? No. Pain Score  0 *  Have you tolerated food without any problems? Yes.    Have you been able to return to your normal activities? Yes.    Do you have any questions about your discharge instructions: Diet   No. Medications  No. Follow up visit  No.  Do you have questions or concerns about your Care? No.  Actions: * If pain score is 4 or above: No action needed, pain <4.  1. Have you developed a fever since your procedure? no  2.   Have you had an respiratory symptoms (SOB or cough) since your procedure? no  3.   Have you tested positive for COVID 19 since your procedure no  4.   Have you had any family members/close contacts diagnosed with the COVID 19 since your procedure?  no   If yes to any of these questions please route to Joylene John, RN and Erenest Rasher, RN

## 2019-09-17 ENCOUNTER — Encounter: Payer: Self-pay | Admitting: Gastroenterology

## 2019-09-20 ENCOUNTER — Encounter: Payer: 59 | Admitting: Gastroenterology

## 2020-03-25 ENCOUNTER — Encounter: Payer: Self-pay | Admitting: Internal Medicine

## 2020-03-25 ENCOUNTER — Ambulatory Visit: Payer: 59 | Admitting: Internal Medicine

## 2020-03-25 ENCOUNTER — Other Ambulatory Visit: Payer: Self-pay

## 2020-03-25 VITALS — BP 132/71 | HR 86 | Temp 97.7°F | Ht 64.0 in | Wt 212.4 lb

## 2020-03-25 DIAGNOSIS — R002 Palpitations: Secondary | ICD-10-CM | POA: Diagnosis not present

## 2020-03-25 DIAGNOSIS — R079 Chest pain, unspecified: Secondary | ICD-10-CM

## 2020-03-25 NOTE — Patient Instructions (Signed)
Medication Instructions:  Your physician recommends that you continue on your current medications as directed. Please refer to the Current Medication list given to you today.  *If you need a refill on your cardiac medications before your next appointment, please call your pharmacy*  Testing/Procedures: Your physician has requested that you have an echocardiogram. Echocardiography is a painless test that uses sound waves to create images of your heart. It provides your doctor with information about the size and shape of your heart and how well your heart's chambers and valves are working. This procedure takes approximately one hour. There are no restrictions for this procedure. -- [address] physician has requested that you have an exercise tolerance test. This is done at Dr. Lysbeth Penner office.  You will need to have the coronavirus test completed prior to your procedure. An appointment has been made at ______ on  _______. This is a Drive Up Visit at the [address] [place]. Please tell them that you are there for pre-procedure testing. Someone will direct you to the appropriate testing line. Stay in your car and someone will be with you shortly. Please make sure to have all other labs completed before this test because you will need to stay quarantined until your procedure. Please take your insurance card to this test.     Follow-Up: At [facility], you and your health needs are our priority.  As part of our continuing mission to provide you with exceptional heart care, we have created designated Provider Care Teams.  These Care Teams include your primary Cardiologist (physician) and Advanced Practice Providers (APPs -  Physician Assistants and Nurse Practitioners) who all work together to provide you with the care you need, when you need it.  We recommend signing up for the patient portal called "MyChart".  Sign up information is provided on this After Visit  Summary.  MyChart is used to connect with patients for Virtual Visits (Telemedicine).  Patients are able to view lab/test results, encounter notes, upcoming appointments, etc.  Non-urgent messages can be sent to your provider as well.   To learn more about what you can do with MyChart, go to [email or web address].    Your next appointment:   About 6 weeks - after testing  The format for your next appointment:   In Person  Provider:   You may see Dr. Debara Pickett or one of the following Advanced Practice Providers on your designated Care Team:    Almyra Deforest, PA-C  Fabian Sharp, [place] or   Roby Lofts, [place]    Other Instructions

## 2020-03-25 NOTE — Progress Notes (Signed)
OFFICE NOTE  Chief Complaint:  Palpitations, establish cardiologist  Primary Care Physician: Sueanne Margarita, DO  HPI:  Sheena Sanders is a 49 y.o. female with a past medial history significant for palpitations, anxiety/depression, GERD and morbid obesity with recent weight loss.  This is a pleasant 49 year old female whose mother is also my patient.  There is a history of atrial fibrillation in the family.  Ms. Ziegler has had longstanding palpitations however more recently they have worsened somewhat.  She had a monitor a year ago placed by her primary care provider which showed normal sinus rhythm with rare PACs and PVCs and no significant arrhythmias.  EKG does demonstrate a PVC today.  She reports some recent improvement in palpitations however when she made her appointment they were much more significant.  She is under a lot of stress and is caring for her mother.  She reports some shortness of breath and fatigue with exertion.  She has had recent significant weight loss due to a commercial diet program.  She has no history of hypertension, diabetes or other confounding risks.  She is on a little bit of medication for reflux which she hopes to stop.  EKG was performed today which shows a sinus rhythm and PVCs with inferior T wave changes, possibly ischemia.  Per her PCP lab work was performed recently demonstrated normal TSH reasonably normal metabolic profile with slightly low sodium and an elevated potassium.  AST was mildly elevated at 52 with a normal ALT.  Cholesterol was total 167, triglycerides 68, HDL 57 and LDL of 96.  PMHx:  Past Medical History:  Diagnosis Date  . Allergy   . Anxiety   . 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 Depression    post partum  . GERD (gastroesophageal reflux disease)     Past Surgical History:  Procedure Laterality Date  . CERVICAL FUSION    . CESAREAN SECTION     x2    FAMHx:  Family History  Problem Relation Age of Onset  . Heart  disease Mother   . Multiple sclerosis Mother   . Hypertension Father   . Breast cancer Paternal Grandmother 33  . Colon cancer Neg Hx   . Colon polyps Neg Hx   . Esophageal cancer Neg Hx   . Rectal cancer Neg Hx   . Stomach cancer Neg Hx     SOCHx:   reports that she has never smoked. She has never used smokeless tobacco. She reports current alcohol use. She reports that she does not use drugs.  ALLERGIES:  Allergies  Allergen Reactions  . Sulfa Antibiotics Swelling and Hives    unknown     ROS: Pertinent items noted in HPI and remainder of comprehensive ROS otherwise negative.  HOME MEDS: Current Outpatient Medications on File Prior to Visit  Medication Sig Dispense Refill  . omeprazole (PRILOSEC) 40 MG capsule Take 40 mg by mouth daily.     No current facility-administered medications on file prior to visit.    LABS/IMAGING: No results found for this or any previous visit (from the past 48 hour(s)). No results found.  LIPID PANEL: No results found for: CHOL, TRIG, HDL, CHOLHDL, VLDL, LDLCALC, LDLDIRECT   WEIGHTS: Wt Readings from Last 3 Encounters:  03/25/20 212 lb 6.4 oz (96.3 kg)  09/12/19 240 lb (108.9 kg)  08/30/19 240 lb (108.9 kg)    VITALS: BP 132/71   Pulse 86   Temp 97.7 F (  36.5 C)   Ht 5\' 4"  (1.626 m)   Wt 212 lb 6.4 oz (96.3 kg)   SpO2 99%   BMI 36.46 kg/m   EXAM: General appearance: alert and no distress Neck: no carotid bruit, no JVD and thyroid not enlarged, symmetric, no tenderness/mass/nodules Lungs: clear to auscultation bilaterally Heart: regular rate and rhythm Abdomen: soft, non-tender; bowel sounds normal; no masses,  no organomegaly Extremities: extremities normal, atraumatic, no cyanosis or edema Pulses: 2+ and symmetric Skin: Skin color, texture, turgor normal. No rashes or lesions Neurologic: Grossly normal Psych: Pleasant  EKG: Sinus rhythm with PVCs at 83, inferior T wave changes possibly ischemia- personally  reviewed  ASSESSMENT: 1. Palpitations-PACs and PVCs seen in the past 2. Atypical chest discomfort with fatigue 3. Family history of atrial fibrillation  PLAN: 1.   Ms. Bronder is likely having more PACs and PVCs.  We talked about dietary modifications to reduce caffeine, improve sleep, decrease stress and more physical exercise.  I suspect her PVCs are benign and may be RV outflow tract PVCs however I would like for her to undergo some exercise stress testing.  She is interested in starting an exercise routine and has been working aggressively at weight loss.  Additionally, this would be helpful to see if her PVCs are suppressed with exercise, suggesting be more likely to be benign.  There is a family history of A. fib but monitoring is failed to show that in the past.  No evidence of A. fib today however she should be vigilant about the possibility of that occurrence.  She said she has had sleep testing in the past which was inconclusive due to lack of effective sleep therefore it is unclear whether she might have sleep apnea.  This may need to be revisited.  Finally we will get an echocardiogram to evaluate for any structural abnormalities of the heart or mitral valve prolapse although no significant murmur or click was heard on exam.  Thanks again for the kind referral.  Plan follow-up with me afterwards.  Pixie Casino, MD, [facility] [place] Director of the Advanced Lipid Disorders &  Cardiovascular Risk Reduction Clinic Diplomate of the American Board of Clinical Lipidology Attending Cardiologist  Direct Dial: [phone]  Fax: [phone]  Website:  [email or web address] Eljay Lave 03/25/2020, 8:16 PM

## 2020-03-28 ENCOUNTER — Telehealth (HOSPITAL_COMMUNITY): Payer: Self-pay | Admitting: *Deleted

## 2020-03-28 NOTE — Telephone Encounter (Signed)
Close encounter 

## 2020-03-29 ENCOUNTER — Other Ambulatory Visit (HOSPITAL_COMMUNITY)
Admission: RE | Admit: 2020-03-29 | Discharge: 2020-03-29 | Disposition: A | Discharge status: Home / Self Care | Payer: 59 | Source: Ambulatory Visit | Attending: Internal Medicine | Admitting: Internal Medicine

## 2020-03-29 DIAGNOSIS — Z01812 Encounter for preprocedural laboratory examination: Secondary | ICD-10-CM | POA: Insufficient documentation

## 2020-03-29 DIAGNOSIS — Z20822 Contact with and (suspected) exposure to covid-19: Secondary | ICD-10-CM | POA: Insufficient documentation

## 2020-03-29 LAB — SARS CORONAVIRUS 2 (TAT 6-24 HRS): SARS Coronavirus 2: NEGATIVE

## 2020-04-02 ENCOUNTER — Other Ambulatory Visit: Payer: Self-pay

## 2020-04-02 ENCOUNTER — Ambulatory Visit (HOSPITAL_COMMUNITY)
Admission: RE | Admit: 2020-04-02 | Discharge: 2020-04-02 | Disposition: A | Discharge status: Home / Self Care | Payer: 59 | Source: Ambulatory Visit | Attending: Cardiovascular Disease | Admitting: Cardiovascular Disease

## 2020-04-02 DIAGNOSIS — R079 Chest pain, unspecified: Secondary | ICD-10-CM

## 2020-04-02 DIAGNOSIS — R002 Palpitations: Secondary | ICD-10-CM

## 2020-04-02 LAB — EXERCISE TOLERANCE TEST
Estimated workload: 12.6 METS
Exercise duration (min): 10 min
Exercise duration (sec): 25 s
MPHR: 171 {beats}/min
Peak HR: 173 {beats}/min
Percent HR: 101 %
Rest HR: 80 {beats}/min

## 2020-04-22 ENCOUNTER — Other Ambulatory Visit: Payer: Self-pay

## 2020-04-22 ENCOUNTER — Ambulatory Visit (HOSPITAL_COMMUNITY): Payer: 59 | Attending: Cardiology

## 2020-04-22 DIAGNOSIS — R002 Palpitations: Secondary | ICD-10-CM

## 2020-04-22 DIAGNOSIS — R079 Chest pain, unspecified: Secondary | ICD-10-CM

## 2020-04-22 LAB — ECHOCARDIOGRAM COMPLETE
Area-P 1/2: 3.05 cm2
S' Lateral: 2.73 cm

## 2020-05-07 ENCOUNTER — Encounter: Payer: Self-pay | Admitting: Internal Medicine

## 2020-05-07 ENCOUNTER — Telehealth (INDEPENDENT_AMBULATORY_CARE_PROVIDER_SITE_OTHER): Payer: 59 | Admitting: Internal Medicine

## 2020-05-07 VITALS — BP 137/88 | HR 63 | Ht 64.0 in | Wt 200.0 lb

## 2020-05-07 DIAGNOSIS — R002 Palpitations: Secondary | ICD-10-CM | POA: Diagnosis not present

## 2020-05-07 DIAGNOSIS — R079 Chest pain, unspecified: Secondary | ICD-10-CM

## 2020-05-07 NOTE — Patient Instructions (Signed)
Medication Instructions:  No Changes In Medications at this time.  *If you need a refill on your cardiac medications before your next appointment, please call your pharmacy*  Follow-Up: At [facility], you and your health needs are our priority.  As part of our continuing mission to provide you with exceptional heart care, we have created designated Provider Care Teams.  These Care Teams include your primary Cardiologist (physician) and Advanced Practice Providers (APPs -  Physician Assistants and Nurse Practitioners) who all work together to provide you with the care you need, when you need it.  We recommend signing up for the patient portal called "MyChart".  Sign up information is provided on this After Visit Summary.  MyChart is used to connect with patients for Virtual Visits (Telemedicine).  Patients are able to view lab/test results, encounter notes, upcoming appointments, etc.  Non-urgent messages can be sent to your provider as well.   To learn more about what you can do with MyChart, go to [email or web address].    Your next appointment:   AS NEEDED   The format for your next appointment:   In Person  Provider:   K. Mali Hilty, MD

## 2020-05-07 NOTE — Progress Notes (Signed)
Virtual Visit via Telephone Note   This visit type was conducted due to national recommendations for restrictions regarding the COVID-19 Pandemic (e.g. social distancing) in an effort to limit this patient's exposure and mitigate transmission in our community.  Due to her co-morbid illnesses, this patient is at least at moderate risk for complications without adequate follow up.  This format is felt to be most appropriate for this patient at this time.  The patient did not have access to video technology/had technical difficulties with video requiring transitioning to audio format only (telephone).  All issues noted in this document were discussed and addressed.  No physical exam could be performed with this format.  Please refer to the patient's chart for her  consent to telehealth for [facility]   Date:  05/07/2020   ID:  Sheena Sanders, DOB [birth year], MRN [id number] The patient was identified using 2 identifiers.  Evaluation Performed:  Follow-Up Visit  Patient Location:  [address] [place] [address]  Provider location:   [address] [place], [place] [address]  PCP:  Sueanne Margarita, DO  Cardiologist:  No primary care provider on file. Electrophysiologist:  None   Chief Complaint:  Follow-up palpitations  History of Present Illness:    Sheena Sanders is a 50 y.o. female who presents via audio/video conferencing for a telehealth visit today.  This is a pleasant 50 year old female whose mother is also my patient.  There is a history of atrial fibrillation in the family.  Sheena Sanders has had longstanding palpitations however more recently they have worsened somewhat.  She had a monitor a year ago placed by her primary care provider which showed normal sinus rhythm with rare PACs and PVCs and no significant arrhythmias.  EKG does demonstrate a PVC today.  She reports some recent improvement in palpitations however when she made her appointment they were much  more significant.  She is under a lot of stress and is caring for her mother.  She reports some shortness of breath and fatigue with exertion.  She has had recent significant weight loss due to a commercial diet program.  She has no history of hypertension, diabetes or other confounding risks.  She is on a little bit of medication for reflux which she hopes to stop.  EKG was performed today which shows a sinus rhythm and PVCs with inferior T wave changes, possibly ischemia.  Per her PCP lab work was performed recently demonstrated normal TSH reasonably normal metabolic profile with slightly low sodium and an elevated potassium.  AST was mildly elevated at 52 with a normal ALT.  Cholesterol was total 167, triglycerides 68, HDL 57 and LDL of 96.  05/07/2020  Sheena Sanders returns for follow-up today. She reports marked improvement in her palpitations, despite the fact that she is not on treatment.  Overall she feels well.  She underwent exercise treadmill stress testing and exercised for over 10 minutes and 12 METS with no evidence of PVCs or PACs that increased during exercise or were significant during recovery.  She also had an echocardiogram which was interpreted as normal systolic function with LVEF 60 to 65%, mild left atrial enlargement and grade 2 diastolic dysfunction.  I personally reviewed the echo, however noted that there was a lateral E prime velocity greater than 10 with an EDA ratio less than 1.5, suggestive of normal (not pseudo normal) diastolic function.  I communicated this finding to her.  Overall she feels like she is  doing well.  She feels better with exercise and denies any dyspnea.  There was no pulmonary hypertension on the echo.  She has managed to lose about 50 pounds.  The patient does not have symptoms concerning for COVID-19 infection (fever, chills, cough, or new SHORTNESS OF BREATH).    Prior CV studies:   The following studies were reviewed today:  Chart reviewed  PMHx:  Past  Medical History:  Diagnosis Date  . Allergy   . Anxiety   . Cancer ([place])    basal cell under left eye  . Depression    post partum  . GERD (gastroesophageal reflux disease)     Past Surgical History:  Procedure Laterality Date  . CERVICAL FUSION    . CESAREAN SECTION     x2    FAMHx:  Family History  Problem Relation Age of Onset  . Heart disease Mother   . Multiple sclerosis Mother   . Hypertension Father   . Breast cancer Paternal Grandmother 45  . Colon cancer Neg Hx   . Colon polyps Neg Hx   . Esophageal cancer Neg Hx   . Rectal cancer Neg Hx   . Stomach cancer Neg Hx     SOCHx:   reports that she has never smoked. She has never used smokeless tobacco. She reports current alcohol use. She reports that she does not use drugs.  ALLERGIES:  Allergies  Allergen Reactions  . Sulfa Antibiotics Swelling and Hives    unknown     MEDS:  Current Meds  Medication Sig  . omeprazole (PRILOSEC) 40 MG capsule Take 40 mg by mouth daily.     ROS: Pertinent items noted in HPI and remainder of comprehensive ROS otherwise negative.  Labs/Other Tests and Data Reviewed:    Recent Labs: No results found for requested labs within last 8760 hours.   Recent Lipid Panel No results found for: CHOL, TRIG, HDL, CHOLHDL, LDLCALC, LDLDIRECT  Wt Readings from Last 3 Encounters:  05/07/20 200 lb (90.7 kg)  03/25/20 212 lb 6.4 oz (96.3 kg)  09/12/19 240 lb (108.9 kg)     Exam:    Vital Signs:  BP 137/88   Pulse 63   Ht 5\' 4"  (1.626 m)   Wt 200 lb (90.7 kg)   BMI 34.33 kg/m    Exam deferred to telephone visit  ASSESSMENT & PLAN:    1. Palpitations-PACs and PVCs seen in the past 2. Atypical chest discomfort with fatigue 3. Family history of atrial fibrillation  Overall, Sheena Sanders has had significant improvement in her palpitations for unknown reasons.  She is not on therapy.  Her stress test was low risk.  Her echo I believe is essentially normal with some mild left  atrial enlargement.  This may put her at increased risk for palpitations.  I am encouraged by her weight loss and improved symptoms with physical activity.  She says despite the weight loss however her blood pressure has remained elevated and there is family history of elevated blood pressure so she may ultimately require some medication for this.  I do not think that she needs any medication to suppress her PACs or PVCs at this time.  I am happy to see her back for either issue in the future as needed.  COVID-19 Education: The signs and symptoms of COVID-19 were discussed with the patient and how to seek care for testing (follow up with PCP or arrange E-visit).  The importance of social distancing was  discussed today.  Patient Risk:   After full review of this patients clinical status, I feel that they are at least moderate risk at this time.  Time:   Today, I have spent 15 minutes with the patient with telehealth technology discussing hypertension, weight loss, echo and stress test findings, palpitations.     Medication Adjustments/Labs and Tests Ordered: Current medicines are reviewed at length with the patient today.  Concerns regarding medicines are outlined above.   Tests Ordered: No orders of the defined types were placed in this encounter.   Medication Changes: No orders of the defined types were placed in this encounter.   Disposition:  prn  Pixie Casino, MD, FACC, [place] Director of the Advanced Lipid Disorders &  Cardiovascular Risk Reduction Clinic Diplomate of the American Board of Clinical Lipidology Attending Cardiologist  Direct Dial: [phone]  Fax: [phone]  Website:  [email or web address]  Pixie Casino, MD  05/07/2020 10:09 AM

## 2022-06-02 NOTE — Telephone Encounter (Signed)
error 

## 2023-01-03 ENCOUNTER — Ambulatory Visit: Payer: 59 | Attending: Physician Assistant | Admitting: Physician Assistant

## 2023-01-03 VITALS — BP 132/84 | HR 66 | Ht 64.0 in | Wt 181.8 lb

## 2023-01-03 DIAGNOSIS — R002 Palpitations: Secondary | ICD-10-CM | POA: Diagnosis not present

## 2023-01-03 NOTE — Progress Notes (Unsigned)
  Cardiology Office Note:  .   Date:  01/03/2023  ID:  Sheena Sanders, DOB 30-Dec-1970, MRN [id number] PCP: Charlane Ferretti, DO  [facility] Providers Cardiologist:  None { Click to update primary MD,subspecialty MD or APP then REFRESH:1}   History of Present Illness: Sheena Sanders is a 52 y.o. female with past medical history of palpitation, anxiety/depression, GERD and morbid obesity.  She has a history of atrial fibrillation in her family.  Heart monitor placed by her PCP shows normal sinus rhythm with rare PAC and PVCs, no significant arrhythmia.  TSH was normal.  Patient was last seen by Dr. Rennis Golden in November 2021 at which time she was doing well.  Echocardiogram obtained on 04/22/2020 showed EF 60 to 65%, grade 2 DD, RVSP 32.4 mmHg.  ETT obtained on 04/02/2020 showed patient was able to achieve 12.6 METS of activity level, there was no ST changes during the exercise.  Patient presents today for evaluation of palpitation.  Symptom has significantly worsened since her recent diagnosis of COVID in mid August.  She had some nasal congestion but ultimately recovered well without significant symptom from COVID.  She is described her palpitations as normal heartbeat followed by a brief pause followed by a strong thumping sensation prior to resuming normal beat.  The symptom is also accompanied by mild dizziness.  Symptom is more consistent with symptomatic PVCs.  He is not unusual to have increased PVCs after COVID infection.  I recommended a 3-day ZIO monitor to quantify the PVC burden.  If PVC burden is very high, may consider low-dose metoprolol to help with rate control.  Patient can follow-up in 3 weeks for reassessment.  ROS: ***  Studies Reviewed: .        *** Risk Assessment/Calculations:   {Does this patient have ATRIAL FIBRILLATION?:[id number]} No BP recorded.  {Refresh Note OR Click here to enter BP  :1}***       Physical Exam:   VS:  There were no vitals taken for this visit.    Wt Readings from Last 3 Encounters:  05/07/20 200 lb (90.7 kg)  03/25/20 212 lb 6.4 oz (96.3 kg)  09/12/19 240 lb (108.9 kg)    GEN: Well nourished, well developed in no acute distress NECK: No JVD; No carotid bruits CARDIAC: ***RRR, no murmurs, rubs, gallops RESPIRATORY:  Clear to auscultation without rales, wheezing or rhonchi  ABDOMEN: Soft, non-tender, non-distended EXTREMITIES:  No edema; No deformity   ASSESSMENT AND PLAN: .   ***    {Are you ordering a CV Procedure (e.g. stress test, cath, DCCV, TEE, etc)?   Press F2        :[id number]}  Dispo: ***  Signed, Azalee Course, PA

## 2023-01-03 NOTE — Patient Instructions (Signed)
Medication Instructions:  NO CHANGES *If you need a refill on your cardiac medications before your next appointment, please call your pharmacy*   Lab Work: NO LABS If you have labs (blood work) drawn today and your tests are completely normal, you will receive your results only by: MyChart Message (if you have MyChart) OR A paper copy in the mail If you have any lab test that is abnormal or we need to change your treatment, we will call you to review the results.   Testing/Procedures: ZIO XT Monitor- 3 DAY SEE INSTRUCTIONS BELOW.   Follow-Up: At [facility], you and your health needs are our priority.  As part of our continuing mission to provide you with exceptional heart care, we have created designated Provider Care Teams.  These Care Teams include your primary Cardiologist (physician) and Advanced Practice Providers (APPs -  Physician Assistants and Nurse Practitioners) who all work together to provide you with the care you need, when you need it.  We recommend signing up for the patient portal called "MyChart".  Sign up information is provided on this After Visit Summary.  MyChart is used to connect with patients for Virtual Visits (Telemedicine).  Patients are able to view lab/test results, encounter notes, upcoming appointments, etc.  Non-urgent messages can be sent to your provider as well.   To learn more about what you can do with MyChart, go to [email or web address].    Your next appointment:   3 week(s)  Provider:   Azalee Course, PA  Other Instructions ZIO XT- Long Term Monitor Instructions  Your physician has requested you wear a ZIO patch monitor for 3 days.  This is a single patch monitor. [employer] supplies one patch monitor per enrollment. Additional stickers are not available. Please do not apply patch if you will be having a Nuclear Stress Test,  Echocardiogram, Cardiac CT, MRI, or Chest Xray during the period you would be wearing the  monitor. The patch  cannot be worn during these tests. You cannot remove and re-apply the  ZIO XT patch monitor.  Your ZIO patch monitor will be mailed 3 day USPS to your address on file. It may take 3-5 days  to receive your monitor after you have been enrolled.  Once you have received your monitor, please review the enclosed instructions. Your monitor  has already been registered assigning a specific monitor serial # to you.  Billing and Patient Assistance Program Information  We have supplied [employer] with any of your insurance information on file for billing purposes. [employer] offers a sliding scale Patient Assistance Program for patients that do not have  insurance, or whose insurance does not completely cover the cost of the ZIO monitor.  You must apply for the Patient Assistance Program to qualify for this discounted rate.  To apply, please call [employer] at [phone], select option 4, select option 2, ask to apply for  Patient Assistance Program. Sheena Sanders will ask your household income, and how many people  are in your household. They will quote your out-of-pocket cost based on that information.  [employer] will also be able to set up a 42-month, interest-free payment plan if needed.  Applying the monitor   Shave hair from upper left chest.  Hold abrader disc by orange tab. Rub abrader in 40 strokes over the upper left chest as  indicated in your monitor instructions.  Clean area with 4 enclosed alcohol pads. Let dry.  Apply patch as indicated in monitor instructions. Patch will be placed  under collarbone on left  side of chest with arrow pointing upward.  Rub patch adhesive wings for 2 minutes. Remove white label marked "1". Remove the white  label marked "2". Rub patch adhesive wings for 2 additional minutes.  While looking in a mirror, press and release button in center of patch. A small green light will  flash 3-4 times. This will be your only indicator that the monitor has been turned on.  Do not  shower for the first 24 hours. You may shower after the first 24 hours.  Press the button if you feel a symptom. You will hear a small click. Record Date, Time and  Symptom in the Patient Logbook.  When you are ready to remove the patch, follow instructions on the last 2 pages of Patient  Logbook. Stick patch monitor onto the last page of Patient Logbook.  Place Patient Logbook in the blue and white box. Use locking tab on box and tape box closed  securely. The blue and white box has prepaid postage on it. Please place it in the mailbox as  soon as possible. Your physician should have your test results approximately 7 days after the  monitor has been mailed back to [facility].  Call [facility] Customer Care at [phone] if you have questions regarding  your ZIO XT patch monitor. Call them immediately if you see an orange light blinking on your  monitor.  If your monitor falls off in less than 4 days, contact our Monitor department at [phone].  If your monitor becomes loose or falls off after 4 days call [employer] at [phone] for  suggestions on securing your monitor

## 2023-01-04 ENCOUNTER — Ambulatory Visit: Payer: 59 | Attending: Physician Assistant

## 2023-01-04 DIAGNOSIS — R002 Palpitations: Secondary | ICD-10-CM

## 2023-01-04 NOTE — Progress Notes (Unsigned)
Enrolled patient for a 3 day Zio XT monitor to be mailed to patients home   Hilty to read

## 2023-01-06 DIAGNOSIS — R002 Palpitations: Secondary | ICD-10-CM

## 2023-01-28 ENCOUNTER — Encounter: Payer: Self-pay | Admitting: Physician Assistant

## 2023-01-28 ENCOUNTER — Ambulatory Visit: Payer: 59 | Attending: Physician Assistant | Admitting: Physician Assistant

## 2023-01-28 VITALS — BP 124/72 | HR 76 | Ht 64.5 in | Wt 180.0 lb

## 2023-01-28 DIAGNOSIS — I493 Ventricular premature depolarization: Secondary | ICD-10-CM | POA: Diagnosis not present

## 2023-01-28 MED ORDER — METOPROLOL TARTRATE 25 MG PO TABS: 12.5000 mg | ORAL_TABLET | ORAL | 4 refills | Status: AC | PRN

## 2023-01-28 NOTE — Patient Instructions (Signed)
Medication Instructions:  START METOPROLOL TARTRATE 12.5 MG AS NEEDED FOR INCREASE PALPITATION *If you need a refill on your cardiac medications before your next appointment, please call your pharmacy*   Lab Work: NO LABS If you have labs (blood work) drawn today and your tests are completely normal, you will receive your results only by: MyChart Message (if you have MyChart) OR A paper copy in the mail If you have any lab test that is abnormal or we need to change your treatment, we will call you to review the results.   Testing/Procedures: NO TESTING   Follow-Up: At [facility], you and your health needs are our priority.  As part of our continuing mission to provide you with exceptional heart care, we have created designated Provider Care Teams.  These Care Teams include your primary Cardiologist (physician) and Advanced Practice Providers (APPs -  Physician Assistants and Nurse Practitioners) who all work together to provide you with the care you need, when you need it.  We recommend signing up for the patient portal called "MyChart".  Sign up information is provided on this After Visit Summary.  MyChart is used to connect with patients for Virtual Visits (Telemedicine).  Patients are able to view lab/test results, encounter notes, upcoming appointments, etc.  Non-urgent messages can be sent to your provider as well.   To learn more about what you can do with MyChart, go to [email or web address].    Your next appointment:   6 month(s)  Provider:   Chrystie Nose, MD

## 2023-01-28 NOTE — Progress Notes (Unsigned)
Cardiology Office Note:  .   Date:  01/30/2023  ID:  Ezequiel Kayser, DOB 07/19/1970, MRN [id number] PCP: Charlane Ferretti, DO  [facility] Providers Cardiologist:  Chrystie Nose, MD     History of Present Illness: Sheena Sanders is a 52 y.o. female with past medical history of palpitation, anxiety/depression, GERD and morbid obesity.  She has a history of atrial fibrillation in her family.  Heart monitor placed by her PCP shows normal sinus rhythm with rare PAC and PVCs, no significant arrhythmia.  TSH was normal.  Patient was last seen by Dr. Rennis Golden in November 2021 at which time she was doing well.  Echocardiogram obtained on 04/22/2020 showed EF 60 to 65%, grade 2 DD, RVSP 32.4 mmHg.  ETT obtained on 04/02/2020 showed patient was able to achieve 12.6 METS of activity level, there was no ST changes during the exercise.   Patient presents today for follow-up.  Her palpitation has improved.  She still occasionally have a bad day where she has a lot more palpitation than others.  We have reviewed the recent heart monitor that was placed during the last office visit.  She only has a less than 1% PVC burden.  She clearly has symptomatic PVC as every single triggered events was in response to a PVC.  We discussed various options.  Given low PVC burden, I do not think the patient need a scheduled rate control medication.  We ultimately decided to give her low-dose 25 mg tablet of metoprolol tartrate and instructed her to take a half a tablet on a as needed basis for increased PVCs.  Overall, she is doing well and can follow-up with Dr. Rennis Golden in 6 months.   ROS:   She complains of occasional palpitation, no chest pain, shortness of breath or lower extremity edema.  Studies Reviewed: .        Cardiac Studies & Procedures     STRESS TESTS  EXERCISE TOLERANCE TEST (ETT) 04/02/2020  Narrative  Patient exercised according to the CVN-BRuce protocol for 10:63min achieving a work level of 12.6  METs  The resting HR was 78bpm. The patient reached a maximal HR of 173bpm which is 101% of the maximal, age-predicted heart rate.  There was no ST segment deviation noted during stress.  No T wave inversion was noted during stress.  No electrocardiographic evidence of ischemia during stress test  Laurance Flatten, MD   ECHOCARDIOGRAM  ECHOCARDIOGRAM COMPLETE 04/22/2020  Narrative ECHOCARDIOGRAM REPORT    Patient Name:   Sheena Sanders Date of Exam: 04/22/2020 Medical Rec #:  [id number]      Height:       64.0 in Accession #:    [id number]     Weight:       212.4 lb Date of Birth:  1970-08-03       BSA:          2.007 m Patient Age:    49 years       BP:           132/71 mmHg Patient Gender: F              HR:           71 bpm. Exam Location:  [address]  Procedure: 2D Echo, Cardiac Doppler and Color Doppler  Indications:    R07.9 Chest Pain R00.2 Palpitations  History:        Patient has no prior history of Echocardiogram  examinations. Arrythmias:PVC and PAC, Signs/Symptoms:Chest Pain and Shortness of Breath; Risk Factors:Family History of Coronary Artery Disease. History of morbid obesity with recent weight loss.  Sonographer:    Farrel Conners RDCS Referring Phys: [phone] KENNETH C HILTY  IMPRESSIONS   1. Left ventricular ejection fraction, by estimation, is 60 to 65%. The left ventricle has normal function. The left ventricle has no regional wall motion abnormalities. Left ventricular diastolic parameters are consistent with Grade II diastolic dysfunction (pseudonormalization). 2. Right ventricular systolic function is normal. The right ventricular size is normal. There is normal pulmonary artery systolic pressure. The estimated right ventricular systolic pressure is 32.4 mmHg. 3. Left atrial size was mildly dilated. 4. The mitral valve is normal in structure. No evidence of mitral valve regurgitation. No evidence of mitral stenosis. 5. The aortic valve is  normal in structure. Aortic valve regurgitation is not visualized. No aortic stenosis is present. 6. The inferior vena cava is normal in size with greater than 50% respiratory variability, suggesting right atrial pressure of 3 mmHg.  FINDINGS Left Ventricle: Left ventricular ejection fraction, by estimation, is 60 to 65%. The left ventricle has normal function. The left ventricle has no regional wall motion abnormalities. The left ventricular internal cavity size was normal in size. There is no left ventricular hypertrophy. Left ventricular diastolic parameters are consistent with Grade II diastolic dysfunction (pseudonormalization).  Right Ventricle: The right ventricular size is normal. No increase in right ventricular wall thickness. Right ventricular systolic function is normal. There is normal pulmonary artery systolic pressure. The tricuspid regurgitant velocity is 2.71 m/s, and with an assumed right atrial pressure of 3 mmHg, the estimated right ventricular systolic pressure is 32.4 mmHg.  Left Atrium: Left atrial size was mildly dilated.  Right Atrium: Right atrial size was normal in size.  Pericardium: There is no evidence of pericardial effusion.  Mitral Valve: The mitral valve is normal in structure. No evidence of mitral valve regurgitation. No evidence of mitral valve stenosis.  Tricuspid Valve: The tricuspid valve is normal in structure. Tricuspid valve regurgitation is mild . No evidence of tricuspid stenosis.  Aortic Valve: The aortic valve is normal in structure. Aortic valve regurgitation is not visualized. No aortic stenosis is present.  Pulmonic Valve: The pulmonic valve was normal in structure. Pulmonic valve regurgitation is not visualized. No evidence of pulmonic stenosis.  Aorta: The aortic root is normal in size and structure.  Venous: The inferior vena cava is normal in size with greater than 50% respiratory variability, suggesting right atrial pressure of 3  mmHg.  IAS/Shunts: No atrial level shunt detected by color flow Doppler.   LEFT VENTRICLE PLAX 2D LVIDd:         4.25 cm  Diastology LVIDs:         2.73 cm  LV e' medial:    8.70 cm/s LV PW:         0.90 cm  LV E/e' medial:  13.3 LV IVS:        0.80 cm  LV e' lateral:   12.50 cm/s LVOT diam:     1.80 cm  LV E/e' lateral: 9.2 LV SV:         62 LV SV Index:   31 LVOT Area:     2.54 cm   RIGHT VENTRICLE RV S prime:     13.40 cm/s TAPSE (M-mode): 2.3 cm  LEFT ATRIUM             Index  RIGHT ATRIUM           Index LA diam:        4.05 cm 2.02 cm/m  RA Area:     18.20 cm LA Vol (A2C):   55.1 ml 27.45 ml/m RA Volume:   47.00 ml  23.42 ml/m LA Vol (A4C):   80.2 ml 39.96 ml/m LA Biplane Vol: 72.7 ml 36.22 ml/m AORTIC VALVE LVOT Vmax:   96.80 cm/s LVOT Vmean:  61.100 cm/s LVOT VTI:    0.242 m  AORTA Ao Root diam: 2.70 cm Ao Asc diam:  2.70 cm  MITRAL VALVE                TRICUSPID VALVE MV Area (PHT): cm          TR Peak grad:   29.4 mmHg MV Decel Time: 249 msec     TR Vmax:        271.00 cm/s MV E velocity: 115.50 cm/s MV A velocity: 89.20 cm/s   SHUNTS MV E/A ratio:  1.29         Systemic VTI:  0.24 m Systemic Diam: 1.80 cm  Donato Schultz MD Electronically signed by Donato Schultz MD Signature Date/Time: 04/22/2020/1:47:21 PM    Final    MONITORS  LONG TERM MONITOR (3-14 DAYS) 01/13/2023  Narrative Patch Wear Time:  4 days and 1 hours (2024-09-12T15:47:35-0400 to 2024-09-16T16:58:17-0400)  Patient had a min HR of 47 bpm, max HR of 129 bpm, and avg HR of 73 bpm. Predominant underlying rhythm was Sinus Rhythm. Isolated SVEs were rare (<1.0%), and no SVE Couplets or SVE Triplets were present. Isolated VEs were rare (<1.0%), and no VE Couplets or VE Triplets were present.  Essentially normal monitor. A few PAC's and PVC's noted, but no abnormal rhythms were noted.  Chrystie Nose, MD, [facility], FACP [place]  [facility] Medical Director of the  [facility] Diplomate of the American Board of Clinical Lipidology Attending Cardiologist Direct Dial: [phone]  Fax: [phone] Website:  [email or web address]           Risk Assessment/Calculations:            Physical Exam:   VS:  BP 124/72   Pulse 76   Ht 5' 4.5" (1.638 m)   Wt 180 lb (81.6 kg)   SpO2 99%   BMI 30.42 kg/m    Wt Readings from Last 3 Encounters:  01/28/23 180 lb (81.6 kg)  01/03/23 181 lb 12.8 oz (82.5 kg)  05/07/20 200 lb (90.7 kg)    GEN: Well nourished, well developed in no acute distress NECK: No JVD; No carotid bruits CARDIAC: RRR, no murmurs, rubs, gallops RESPIRATORY:  Clear to auscultation without rales, wheezing or rhonchi  ABDOMEN: Soft, non-tender, non-distended EXTREMITIES:  No edema; No deformity   ASSESSMENT AND PLAN: .    Premature Ventricular Contractions (PVCs) Symptomatic PVCs with a burden of less than 1% on 4-day heart monitor. No dangerous arrhythmias or conduction delays noted. PVCs are benign but can be symptomatic and bothersome. - Prescribe Metoprolol 25mg , half tablet as needed for symptomatic PVCs. - Follow-up in 6 months or earlier if PVCs become more frequent or bothersome.         Dispo: Follow-up in 6 months  Signed, Azalee Course, [place]
